# Patient Record
Sex: Male | Born: 1985 | Race: White | Hispanic: No | Marital: Single | Smoking: Never smoker
Health system: Southern US, Community
[De-identification: ages and names within clinical notes are randomized; demographics above are authoritative.]

---

## 2019-01-07 ENCOUNTER — Ambulatory Visit (HOSPITAL_COMMUNITY)
Admission: EM | Admit: 2019-01-07 | Discharge: 2019-01-07 | Disposition: A | Discharge status: Home / Self Care | Payer: BLUE CROSS/BLUE SHIELD | Attending: Family Medicine | Admitting: Family Medicine

## 2019-01-07 ENCOUNTER — Ambulatory Visit (INDEPENDENT_AMBULATORY_CARE_PROVIDER_SITE_OTHER): Payer: BLUE CROSS/BLUE SHIELD

## 2019-01-07 ENCOUNTER — Encounter (HOSPITAL_COMMUNITY): Payer: Self-pay | Admitting: Family Medicine

## 2019-01-07 DIAGNOSIS — J111 Influenza due to unidentified influenza virus with other respiratory manifestations: Secondary | ICD-10-CM

## 2019-01-07 DIAGNOSIS — R05 Cough: Secondary | ICD-10-CM | POA: Diagnosis not present

## 2019-01-07 DIAGNOSIS — R0602 Shortness of breath: Secondary | ICD-10-CM

## 2019-01-07 MED ORDER — ACETAMINOPHEN 325 MG PO TABS
ORAL_TABLET | ORAL | Status: AC
  Filled 2019-01-07: qty 2

## 2019-01-07 MED ORDER — OSELTAMIVIR PHOSPHATE 75 MG PO CAPS: 75.0000 mg | ORAL_CAPSULE | Freq: Two times a day (BID) | ORAL | 0 refills | Status: AC

## 2019-01-07 MED ORDER — BENZONATATE 100 MG PO CAPS: 100.0000 mg | ORAL_CAPSULE | Freq: Three times a day (TID) | ORAL | 0 refills | Status: AC | PRN

## 2019-01-07 MED ORDER — IBUPROFEN 800 MG PO TABS: 800.0000 mg | ORAL_TABLET | Freq: Three times a day (TID) | ORAL | 0 refills | Status: AC

## 2019-01-07 MED ORDER — ACETAMINOPHEN 325 MG PO TABS
650.0000 mg | ORAL_TABLET | Freq: Once | ORAL | Status: AC
  Administered 2019-01-07: 650 mg via ORAL

## 2019-01-07 NOTE — ED Provider Notes (Signed)
[facility]    CSN: [id number] Arrival date & time: 01/07/19  4142     History   Chief Complaint Chief Complaint  Patient presents with  . Fever  . Generalized Body Aches    HPI Bruce Bishop is a 33 y.o. male.   This is a 33 year old gentleman who is new to the urgent care at [facility].  He presents with flu-like symptoms.  Reports fever/ chills, HA, chest congestion, right ear pain since last night and cough.  Cough is productive of clear phlegm.  No h/o smoking.  Has had bronchitis.  He works [employer].     History reviewed. No pertinent past medical history.  There are no active problems to display for this patient.   History reviewed. No pertinent surgical history.     Home Medications    Prior to Admission medications   Medication Sig Start Date End Date Taking? Authorizing Provider  benzonatate (TESSALON) 100 MG capsule Take 1-2 capsules (100-200 mg total) by mouth 3 (three) times daily as needed for cough. 01/07/19   Elvina Sidle, MD  ibuprofen (ADVIL,MOTRIN) 800 MG tablet Take 1 tablet (800 mg total) by mouth 3 (three) times daily. 01/07/19   Elvina Sidle, MD  oseltamivir (TAMIFLU) 75 MG capsule Take 1 capsule (75 mg total) by mouth every 12 (twelve) hours. 01/07/19   Elvina Sidle, MD    Family History No family history on file.  Social History Social History   Tobacco Use  . Smoking status: Never Smoker  Substance Use Topics  . Alcohol use: Yes  . Drug use: Not on file     Allergies   Patient has no known allergies.   Review of Systems Review of Systems  Constitutional: Positive for chills, fatigue and fever.  HENT: Positive for congestion, ear pain, sinus pressure, sinus pain and sore throat.   Respiratory: Positive for cough and shortness of breath.   Musculoskeletal: Positive for myalgias.     Physical Exam Triage Vital Signs ED Triage Vitals  Enc Vitals Group     BP      Pulse      Resp      Temp      Temp src      SpO2      Weight      Height      Head Circumference      Peak Flow      Pain Score      Pain Loc      Pain Edu?      Excl. in GC?    No data found.  Updated Vital Signs BP 137/78   Pulse (!) 121   Temp (!) 101.8 F (38.8 C)   Resp 18   SpO2 97%    Physical Exam Vitals signs and nursing note reviewed.  Constitutional:      Appearance: He is ill-appearing.  HENT:     Left Ear: Tympanic membrane normal.     Ears:     Comments: Right TM full    Nose: Congestion present.     Mouth/Throat:     Mouth: Mucous membranes are dry.     Pharynx: Posterior oropharyngeal erythema present. No oropharyngeal exudate.  Eyes:     Conjunctiva/sclera: Conjunctivae normal.  Neck:     Musculoskeletal: Normal range of motion and neck supple.  Cardiovascular:     Rate and Rhythm: Normal rate and regular rhythm.  Pulmonary:     Effort: Prolonged expiration present.  Breath sounds: Examination of the right-upper field reveals rhonchi. Examination of the left-upper field reveals rhonchi. Examination of the right-middle field reveals rhonchi. Examination of the left-middle field reveals rhonchi. Examination of the right-lower field reveals rhonchi and rales. Examination of the left-lower field reveals rhonchi and rales. Rhonchi and rales present.     Comments: Scattered rhonchi  Skin:    General: Skin is warm and dry.  Neurological:     Mental Status: He is alert.  Psychiatric:        Mood and Affect: Mood normal.        Behavior: Behavior normal.      UC Treatments / Results  Labs (all labs ordered are listed, but only abnormal results are displayed) Labs Reviewed - No data to display  EKG None  Radiology Chest film shows no infiltrate.  Cardiac silhouette is normal. Procedures Procedures (including critical care time)  Medications Ordered in UC Medications  acetaminophen (TYLENOL) tablet 650 mg (650 mg Oral Given 01/07/19 1000)    Initial Impression /  Assessment and Plan / UC Course  I have reviewed the triage vital signs and the nursing notes.  Pertinent labs & imaging results that were available during my care of the patient were reviewed by me and considered in my medical decision making (see chart for details).    Final Clinical Impressions(s) / UC Diagnoses   Final diagnoses:  Influenza   Discharge Instructions   None    ED Prescriptions    Medication Sig Dispense Auth. Provider   ibuprofen (ADVIL,MOTRIN) 800 MG tablet Take 1 tablet (800 mg total) by mouth 3 (three) times daily. 21 tablet Elvina Sidle, MD   benzonatate (TESSALON) 100 MG capsule Take 1-2 capsules (100-200 mg total) by mouth 3 (three) times daily as needed for cough. 40 capsule Elvina Sidle, MD   oseltamivir (TAMIFLU) 75 MG capsule Take 1 capsule (75 mg total) by mouth every 12 (twelve) hours. 10 capsule Elvina Sidle, MD     Controlled Substance Prescriptions [place] Controlled Substance Registry consulted? Not Applicable   Elvina Sidle, MD 01/07/19 1112

## 2019-01-07 NOTE — ED Triage Notes (Signed)
Pt c/o fever, body aches, since last night.

## 2020-07-15 IMAGING — DX DG CHEST 2V
2 series · 2 of 2 positions shown · non-contrast
Comparison: None.

CLINICAL DATA: Cough and shortness of breath for several days.
Rhonchi bilaterally.

EXAM:
CHEST - 2 VIEW

[chest pa]
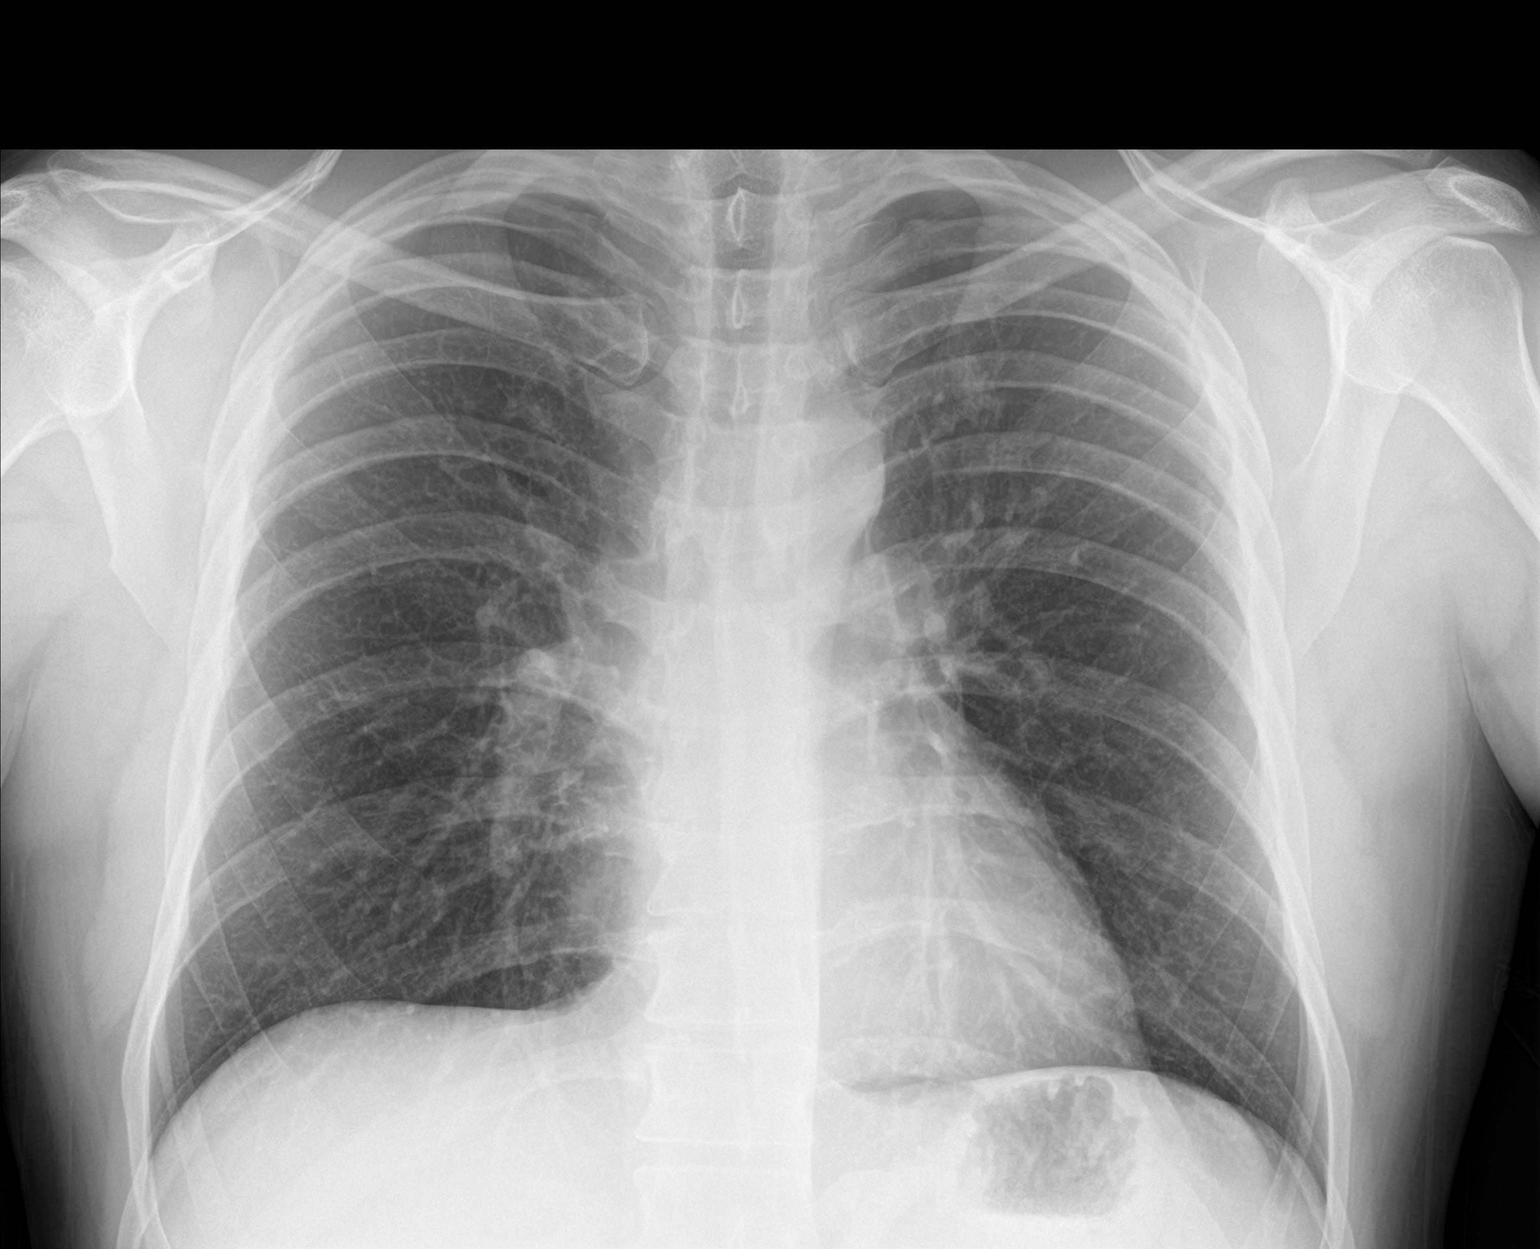

[chest lat]
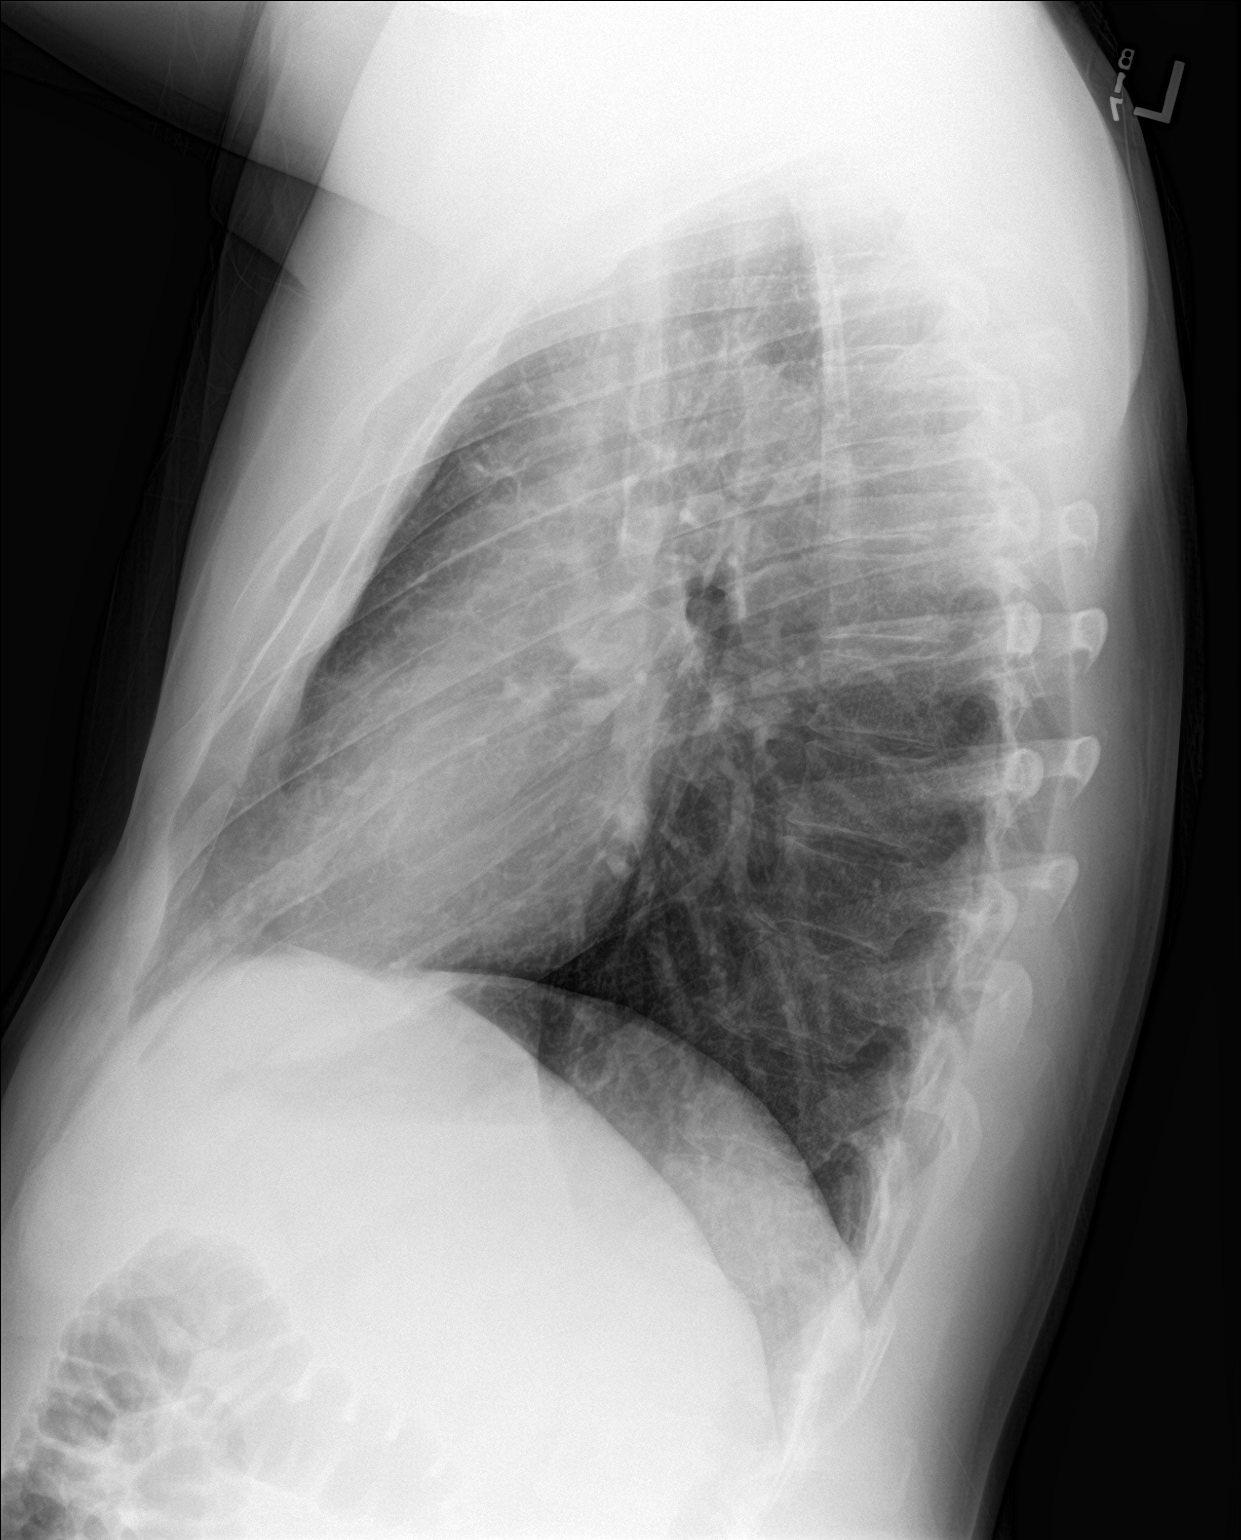

[2 of 2 positions shown; findings below may reference images not displayed]

FINDINGS: The heart size and mediastinal contours are within normal limits.
Both lungs are clear. Mild pectus excavatum noted.
IMPRESSION: No active cardiopulmonary disease.
